# Patient Record
Sex: Female | Born: 1955 | Race: White | Hispanic: No
Health system: Southern US, Community
[De-identification: ages and names within clinical notes are randomized; demographics above are authoritative.]

---

## 2009-04-14 ENCOUNTER — Encounter: Admission: RE | Admit: 2009-04-14 | Discharge: 2009-04-14 | Payer: Self-pay | Admitting: Internal Medicine

## 2010-06-14 ENCOUNTER — Other Ambulatory Visit: Payer: Self-pay | Admitting: Internal Medicine

## 2010-06-14 DIAGNOSIS — Z1231 Encounter for screening mammogram for malignant neoplasm of breast: Secondary | ICD-10-CM

## 2010-06-18 ENCOUNTER — Ambulatory Visit
Admission: RE | Admit: 2010-06-18 | Discharge: 2010-06-18 | Disposition: A | Discharge status: Home / Self Care | Payer: 59 | Source: Ambulatory Visit | Attending: Internal Medicine | Admitting: Internal Medicine

## 2010-06-18 DIAGNOSIS — Z1231 Encounter for screening mammogram for malignant neoplasm of breast: Secondary | ICD-10-CM

## 2011-12-25 ENCOUNTER — Other Ambulatory Visit: Payer: Self-pay | Admitting: Internal Medicine

## 2011-12-25 DIAGNOSIS — Z1231 Encounter for screening mammogram for malignant neoplasm of breast: Secondary | ICD-10-CM

## 2012-02-03 ENCOUNTER — Ambulatory Visit
Admission: RE | Admit: 2012-02-03 | Discharge: 2012-02-03 | Disposition: A | Discharge status: Home / Self Care | Payer: 59 | Source: Ambulatory Visit | Attending: Internal Medicine | Admitting: Internal Medicine

## 2012-02-03 DIAGNOSIS — Z1231 Encounter for screening mammogram for malignant neoplasm of breast: Secondary | ICD-10-CM

## 2012-08-10 ENCOUNTER — Encounter (INDEPENDENT_AMBULATORY_CARE_PROVIDER_SITE_OTHER): Payer: 59 | Admitting: Ophthalmology

## 2012-08-10 DIAGNOSIS — H2510 Age-related nuclear cataract, unspecified eye: Secondary | ICD-10-CM

## 2012-08-10 DIAGNOSIS — E1139 Type 2 diabetes mellitus with other diabetic ophthalmic complication: Secondary | ICD-10-CM

## 2012-08-10 DIAGNOSIS — H43819 Vitreous degeneration, unspecified eye: Secondary | ICD-10-CM

## 2012-08-10 DIAGNOSIS — E11311 Type 2 diabetes mellitus with unspecified diabetic retinopathy with macular edema: Secondary | ICD-10-CM

## 2012-08-10 DIAGNOSIS — E11319 Type 2 diabetes mellitus with unspecified diabetic retinopathy without macular edema: Secondary | ICD-10-CM

## 2012-09-04 ENCOUNTER — Encounter (INDEPENDENT_AMBULATORY_CARE_PROVIDER_SITE_OTHER): Payer: 59 | Admitting: Ophthalmology

## 2012-09-04 DIAGNOSIS — E11311 Type 2 diabetes mellitus with unspecified diabetic retinopathy with macular edema: Secondary | ICD-10-CM

## 2012-09-04 DIAGNOSIS — E11319 Type 2 diabetes mellitus with unspecified diabetic retinopathy without macular edema: Secondary | ICD-10-CM

## 2012-09-04 DIAGNOSIS — E1139 Type 2 diabetes mellitus with other diabetic ophthalmic complication: Secondary | ICD-10-CM

## 2012-09-04 DIAGNOSIS — H43819 Vitreous degeneration, unspecified eye: Secondary | ICD-10-CM

## 2012-10-02 ENCOUNTER — Encounter (INDEPENDENT_AMBULATORY_CARE_PROVIDER_SITE_OTHER): Payer: 59 | Admitting: Ophthalmology

## 2012-10-02 DIAGNOSIS — E11311 Type 2 diabetes mellitus with unspecified diabetic retinopathy with macular edema: Secondary | ICD-10-CM

## 2012-10-02 DIAGNOSIS — H43819 Vitreous degeneration, unspecified eye: Secondary | ICD-10-CM

## 2012-10-02 DIAGNOSIS — I10 Essential (primary) hypertension: Secondary | ICD-10-CM

## 2012-10-02 DIAGNOSIS — H35039 Hypertensive retinopathy, unspecified eye: Secondary | ICD-10-CM

## 2012-10-02 DIAGNOSIS — E11319 Type 2 diabetes mellitus with unspecified diabetic retinopathy without macular edema: Secondary | ICD-10-CM

## 2012-10-02 DIAGNOSIS — E1139 Type 2 diabetes mellitus with other diabetic ophthalmic complication: Secondary | ICD-10-CM

## 2012-11-12 ENCOUNTER — Encounter (INDEPENDENT_AMBULATORY_CARE_PROVIDER_SITE_OTHER): Payer: 59 | Admitting: Ophthalmology

## 2012-11-12 DIAGNOSIS — E11311 Type 2 diabetes mellitus with unspecified diabetic retinopathy with macular edema: Secondary | ICD-10-CM

## 2012-11-12 DIAGNOSIS — E1139 Type 2 diabetes mellitus with other diabetic ophthalmic complication: Secondary | ICD-10-CM

## 2012-11-12 DIAGNOSIS — H43819 Vitreous degeneration, unspecified eye: Secondary | ICD-10-CM

## 2012-11-12 DIAGNOSIS — H2510 Age-related nuclear cataract, unspecified eye: Secondary | ICD-10-CM

## 2012-11-12 DIAGNOSIS — H35039 Hypertensive retinopathy, unspecified eye: Secondary | ICD-10-CM

## 2012-11-12 DIAGNOSIS — E11319 Type 2 diabetes mellitus with unspecified diabetic retinopathy without macular edema: Secondary | ICD-10-CM

## 2012-11-12 DIAGNOSIS — I10 Essential (primary) hypertension: Secondary | ICD-10-CM

## 2012-12-31 ENCOUNTER — Encounter (INDEPENDENT_AMBULATORY_CARE_PROVIDER_SITE_OTHER): Payer: 59 | Admitting: Ophthalmology

## 2013-01-01 ENCOUNTER — Encounter (INDEPENDENT_AMBULATORY_CARE_PROVIDER_SITE_OTHER): Payer: 59 | Admitting: Ophthalmology

## 2013-01-01 DIAGNOSIS — I10 Essential (primary) hypertension: Secondary | ICD-10-CM

## 2013-01-01 DIAGNOSIS — H2510 Age-related nuclear cataract, unspecified eye: Secondary | ICD-10-CM

## 2013-01-01 DIAGNOSIS — H43819 Vitreous degeneration, unspecified eye: Secondary | ICD-10-CM

## 2013-01-01 DIAGNOSIS — E11319 Type 2 diabetes mellitus with unspecified diabetic retinopathy without macular edema: Secondary | ICD-10-CM

## 2013-01-01 DIAGNOSIS — E1139 Type 2 diabetes mellitus with other diabetic ophthalmic complication: Secondary | ICD-10-CM

## 2013-01-01 DIAGNOSIS — H35039 Hypertensive retinopathy, unspecified eye: Secondary | ICD-10-CM

## 2013-01-01 DIAGNOSIS — E11311 Type 2 diabetes mellitus with unspecified diabetic retinopathy with macular edema: Secondary | ICD-10-CM

## 2013-02-12 ENCOUNTER — Encounter (INDEPENDENT_AMBULATORY_CARE_PROVIDER_SITE_OTHER): Payer: 59 | Admitting: Ophthalmology

## 2013-02-12 DIAGNOSIS — H35039 Hypertensive retinopathy, unspecified eye: Secondary | ICD-10-CM

## 2013-02-12 DIAGNOSIS — H2510 Age-related nuclear cataract, unspecified eye: Secondary | ICD-10-CM

## 2013-02-12 DIAGNOSIS — I10 Essential (primary) hypertension: Secondary | ICD-10-CM

## 2013-02-12 DIAGNOSIS — E1165 Type 2 diabetes mellitus with hyperglycemia: Secondary | ICD-10-CM

## 2013-02-12 DIAGNOSIS — E1139 Type 2 diabetes mellitus with other diabetic ophthalmic complication: Secondary | ICD-10-CM

## 2013-02-12 DIAGNOSIS — H43819 Vitreous degeneration, unspecified eye: Secondary | ICD-10-CM

## 2013-02-12 DIAGNOSIS — E11319 Type 2 diabetes mellitus with unspecified diabetic retinopathy without macular edema: Secondary | ICD-10-CM

## 2013-02-12 DIAGNOSIS — E11311 Type 2 diabetes mellitus with unspecified diabetic retinopathy with macular edema: Secondary | ICD-10-CM

## 2013-03-26 ENCOUNTER — Encounter (INDEPENDENT_AMBULATORY_CARE_PROVIDER_SITE_OTHER): Payer: 59 | Admitting: Ophthalmology

## 2013-03-29 ENCOUNTER — Encounter (INDEPENDENT_AMBULATORY_CARE_PROVIDER_SITE_OTHER): Payer: 59 | Admitting: Ophthalmology

## 2013-03-29 DIAGNOSIS — H43819 Vitreous degeneration, unspecified eye: Secondary | ICD-10-CM

## 2013-03-29 DIAGNOSIS — E11311 Type 2 diabetes mellitus with unspecified diabetic retinopathy with macular edema: Secondary | ICD-10-CM

## 2013-03-29 DIAGNOSIS — H35039 Hypertensive retinopathy, unspecified eye: Secondary | ICD-10-CM

## 2013-03-29 DIAGNOSIS — E1165 Type 2 diabetes mellitus with hyperglycemia: Secondary | ICD-10-CM

## 2013-03-29 DIAGNOSIS — E11319 Type 2 diabetes mellitus with unspecified diabetic retinopathy without macular edema: Secondary | ICD-10-CM

## 2013-03-29 DIAGNOSIS — I10 Essential (primary) hypertension: Secondary | ICD-10-CM

## 2013-03-29 DIAGNOSIS — E1139 Type 2 diabetes mellitus with other diabetic ophthalmic complication: Secondary | ICD-10-CM

## 2013-03-29 DIAGNOSIS — H2510 Age-related nuclear cataract, unspecified eye: Secondary | ICD-10-CM

## 2013-05-13 ENCOUNTER — Other Ambulatory Visit: Payer: Self-pay

## 2013-05-13 DIAGNOSIS — Z1231 Encounter for screening mammogram for malignant neoplasm of breast: Secondary | ICD-10-CM

## 2013-05-14 ENCOUNTER — Encounter (INDEPENDENT_AMBULATORY_CARE_PROVIDER_SITE_OTHER): Payer: 59 | Admitting: Ophthalmology

## 2013-05-14 DIAGNOSIS — E11311 Type 2 diabetes mellitus with unspecified diabetic retinopathy with macular edema: Secondary | ICD-10-CM

## 2013-05-14 DIAGNOSIS — I10 Essential (primary) hypertension: Secondary | ICD-10-CM

## 2013-05-14 DIAGNOSIS — E1139 Type 2 diabetes mellitus with other diabetic ophthalmic complication: Secondary | ICD-10-CM

## 2013-05-14 DIAGNOSIS — H35039 Hypertensive retinopathy, unspecified eye: Secondary | ICD-10-CM

## 2013-05-14 DIAGNOSIS — E11319 Type 2 diabetes mellitus with unspecified diabetic retinopathy without macular edema: Secondary | ICD-10-CM

## 2013-05-14 DIAGNOSIS — H43819 Vitreous degeneration, unspecified eye: Secondary | ICD-10-CM

## 2013-05-14 DIAGNOSIS — E1165 Type 2 diabetes mellitus with hyperglycemia: Secondary | ICD-10-CM

## 2013-05-24 ENCOUNTER — Encounter (INDEPENDENT_AMBULATORY_CARE_PROVIDER_SITE_OTHER): Payer: Self-pay

## 2013-05-24 ENCOUNTER — Ambulatory Visit
Admission: RE | Admit: 2013-05-24 | Discharge: 2013-05-24 | Disposition: A | Discharge status: Home / Self Care | Payer: Self-pay | Source: Ambulatory Visit

## 2013-05-24 DIAGNOSIS — Z1231 Encounter for screening mammogram for malignant neoplasm of breast: Secondary | ICD-10-CM

## 2013-06-25 ENCOUNTER — Encounter (INDEPENDENT_AMBULATORY_CARE_PROVIDER_SITE_OTHER): Payer: 59 | Admitting: Ophthalmology

## 2013-06-25 DIAGNOSIS — E11319 Type 2 diabetes mellitus with unspecified diabetic retinopathy without macular edema: Secondary | ICD-10-CM

## 2013-06-25 DIAGNOSIS — H43819 Vitreous degeneration, unspecified eye: Secondary | ICD-10-CM

## 2013-06-25 DIAGNOSIS — H353290 Exudative age-related macular degeneration, unspecified eye, stage unspecified: Secondary | ICD-10-CM

## 2013-06-25 DIAGNOSIS — I10 Essential (primary) hypertension: Secondary | ICD-10-CM

## 2013-06-25 DIAGNOSIS — H3530 Unspecified macular degeneration: Secondary | ICD-10-CM

## 2013-06-25 DIAGNOSIS — E1139 Type 2 diabetes mellitus with other diabetic ophthalmic complication: Secondary | ICD-10-CM

## 2013-06-25 DIAGNOSIS — H35039 Hypertensive retinopathy, unspecified eye: Secondary | ICD-10-CM

## 2013-06-25 DIAGNOSIS — E1165 Type 2 diabetes mellitus with hyperglycemia: Secondary | ICD-10-CM

## 2013-08-13 ENCOUNTER — Encounter (INDEPENDENT_AMBULATORY_CARE_PROVIDER_SITE_OTHER): Payer: 59 | Admitting: Ophthalmology

## 2013-08-13 DIAGNOSIS — H43819 Vitreous degeneration, unspecified eye: Secondary | ICD-10-CM

## 2013-08-13 DIAGNOSIS — E1165 Type 2 diabetes mellitus with hyperglycemia: Secondary | ICD-10-CM

## 2013-08-13 DIAGNOSIS — H35039 Hypertensive retinopathy, unspecified eye: Secondary | ICD-10-CM

## 2013-08-13 DIAGNOSIS — E11311 Type 2 diabetes mellitus with unspecified diabetic retinopathy with macular edema: Secondary | ICD-10-CM

## 2013-08-13 DIAGNOSIS — E1139 Type 2 diabetes mellitus with other diabetic ophthalmic complication: Secondary | ICD-10-CM

## 2013-08-13 DIAGNOSIS — H2510 Age-related nuclear cataract, unspecified eye: Secondary | ICD-10-CM

## 2013-08-13 DIAGNOSIS — E11319 Type 2 diabetes mellitus with unspecified diabetic retinopathy without macular edema: Secondary | ICD-10-CM

## 2013-08-13 DIAGNOSIS — I10 Essential (primary) hypertension: Secondary | ICD-10-CM

## 2013-10-22 ENCOUNTER — Encounter (INDEPENDENT_AMBULATORY_CARE_PROVIDER_SITE_OTHER): Payer: 59 | Admitting: Ophthalmology

## 2013-10-22 DIAGNOSIS — H35039 Hypertensive retinopathy, unspecified eye: Secondary | ICD-10-CM

## 2013-10-22 DIAGNOSIS — E11319 Type 2 diabetes mellitus with unspecified diabetic retinopathy without macular edema: Secondary | ICD-10-CM

## 2013-10-22 DIAGNOSIS — H43819 Vitreous degeneration, unspecified eye: Secondary | ICD-10-CM

## 2013-10-22 DIAGNOSIS — E11311 Type 2 diabetes mellitus with unspecified diabetic retinopathy with macular edema: Secondary | ICD-10-CM

## 2013-10-22 DIAGNOSIS — I10 Essential (primary) hypertension: Secondary | ICD-10-CM

## 2013-10-22 DIAGNOSIS — E1139 Type 2 diabetes mellitus with other diabetic ophthalmic complication: Secondary | ICD-10-CM

## 2013-10-22 DIAGNOSIS — E1165 Type 2 diabetes mellitus with hyperglycemia: Secondary | ICD-10-CM

## 2013-10-22 DIAGNOSIS — H2510 Age-related nuclear cataract, unspecified eye: Secondary | ICD-10-CM

## 2014-01-07 ENCOUNTER — Encounter (INDEPENDENT_AMBULATORY_CARE_PROVIDER_SITE_OTHER): Payer: 59 | Admitting: Ophthalmology

## 2014-01-07 DIAGNOSIS — I10 Essential (primary) hypertension: Secondary | ICD-10-CM

## 2014-01-07 DIAGNOSIS — H35033 Hypertensive retinopathy, bilateral: Secondary | ICD-10-CM

## 2014-01-07 DIAGNOSIS — H43813 Vitreous degeneration, bilateral: Secondary | ICD-10-CM

## 2014-01-07 DIAGNOSIS — E11311 Type 2 diabetes mellitus with unspecified diabetic retinopathy with macular edema: Secondary | ICD-10-CM

## 2014-01-07 DIAGNOSIS — E11329 Type 2 diabetes mellitus with mild nonproliferative diabetic retinopathy without macular edema: Secondary | ICD-10-CM

## 2014-01-07 DIAGNOSIS — E11331 Type 2 diabetes mellitus with moderate nonproliferative diabetic retinopathy with macular edema: Secondary | ICD-10-CM

## 2014-03-25 ENCOUNTER — Encounter (INDEPENDENT_AMBULATORY_CARE_PROVIDER_SITE_OTHER): Payer: 59 | Admitting: Ophthalmology

## 2014-03-28 ENCOUNTER — Encounter (INDEPENDENT_AMBULATORY_CARE_PROVIDER_SITE_OTHER): Payer: 59 | Admitting: Ophthalmology

## 2014-03-28 DIAGNOSIS — H43813 Vitreous degeneration, bilateral: Secondary | ICD-10-CM

## 2014-03-28 DIAGNOSIS — E11331 Type 2 diabetes mellitus with moderate nonproliferative diabetic retinopathy with macular edema: Secondary | ICD-10-CM

## 2014-03-28 DIAGNOSIS — E11311 Type 2 diabetes mellitus with unspecified diabetic retinopathy with macular edema: Secondary | ICD-10-CM

## 2014-03-28 DIAGNOSIS — H35033 Hypertensive retinopathy, bilateral: Secondary | ICD-10-CM

## 2014-03-28 DIAGNOSIS — I10 Essential (primary) hypertension: Secondary | ICD-10-CM | POA: Diagnosis not present

## 2014-03-28 DIAGNOSIS — E11329 Type 2 diabetes mellitus with mild nonproliferative diabetic retinopathy without macular edema: Secondary | ICD-10-CM | POA: Diagnosis not present

## 2014-03-28 DIAGNOSIS — H2513 Age-related nuclear cataract, bilateral: Secondary | ICD-10-CM | POA: Diagnosis not present

## 2014-06-13 ENCOUNTER — Encounter (INDEPENDENT_AMBULATORY_CARE_PROVIDER_SITE_OTHER): Payer: 59 | Admitting: Ophthalmology

## 2014-06-13 DIAGNOSIS — H43813 Vitreous degeneration, bilateral: Secondary | ICD-10-CM | POA: Diagnosis not present

## 2014-06-13 DIAGNOSIS — E11331 Type 2 diabetes mellitus with moderate nonproliferative diabetic retinopathy with macular edema: Secondary | ICD-10-CM

## 2014-06-13 DIAGNOSIS — H2513 Age-related nuclear cataract, bilateral: Secondary | ICD-10-CM | POA: Diagnosis not present

## 2014-06-13 DIAGNOSIS — E11329 Type 2 diabetes mellitus with mild nonproliferative diabetic retinopathy without macular edema: Secondary | ICD-10-CM

## 2014-06-13 DIAGNOSIS — H35033 Hypertensive retinopathy, bilateral: Secondary | ICD-10-CM | POA: Diagnosis not present

## 2014-06-13 DIAGNOSIS — E11311 Type 2 diabetes mellitus with unspecified diabetic retinopathy with macular edema: Secondary | ICD-10-CM

## 2014-06-13 DIAGNOSIS — I10 Essential (primary) hypertension: Secondary | ICD-10-CM | POA: Diagnosis not present

## 2014-09-09 ENCOUNTER — Encounter (INDEPENDENT_AMBULATORY_CARE_PROVIDER_SITE_OTHER): Payer: 59 | Admitting: Ophthalmology

## 2014-09-19 ENCOUNTER — Encounter (INDEPENDENT_AMBULATORY_CARE_PROVIDER_SITE_OTHER): Payer: 59 | Admitting: Ophthalmology

## 2014-09-19 DIAGNOSIS — E11321 Type 2 diabetes mellitus with mild nonproliferative diabetic retinopathy with macular edema: Secondary | ICD-10-CM

## 2014-09-19 DIAGNOSIS — I10 Essential (primary) hypertension: Secondary | ICD-10-CM | POA: Diagnosis not present

## 2014-09-19 DIAGNOSIS — H43813 Vitreous degeneration, bilateral: Secondary | ICD-10-CM | POA: Diagnosis not present

## 2014-09-19 DIAGNOSIS — E11329 Type 2 diabetes mellitus with mild nonproliferative diabetic retinopathy without macular edema: Secondary | ICD-10-CM

## 2014-09-19 DIAGNOSIS — H35033 Hypertensive retinopathy, bilateral: Secondary | ICD-10-CM | POA: Diagnosis not present

## 2014-09-19 DIAGNOSIS — E11311 Type 2 diabetes mellitus with unspecified diabetic retinopathy with macular edema: Secondary | ICD-10-CM

## 2015-01-04 ENCOUNTER — Other Ambulatory Visit: Payer: Self-pay

## 2015-01-04 DIAGNOSIS — Z1231 Encounter for screening mammogram for malignant neoplasm of breast: Secondary | ICD-10-CM

## 2015-01-09 ENCOUNTER — Encounter (INDEPENDENT_AMBULATORY_CARE_PROVIDER_SITE_OTHER): Payer: 59 | Admitting: Ophthalmology

## 2015-01-30 ENCOUNTER — Encounter (INDEPENDENT_AMBULATORY_CARE_PROVIDER_SITE_OTHER): Payer: 59 | Admitting: Ophthalmology

## 2015-02-02 ENCOUNTER — Encounter (INDEPENDENT_AMBULATORY_CARE_PROVIDER_SITE_OTHER): Payer: 59 | Admitting: Ophthalmology

## 2015-02-02 DIAGNOSIS — H35033 Hypertensive retinopathy, bilateral: Secondary | ICD-10-CM

## 2015-02-02 DIAGNOSIS — E11319 Type 2 diabetes mellitus with unspecified diabetic retinopathy without macular edema: Secondary | ICD-10-CM

## 2015-02-02 DIAGNOSIS — E113293 Type 2 diabetes mellitus with mild nonproliferative diabetic retinopathy without macular edema, bilateral: Secondary | ICD-10-CM | POA: Diagnosis not present

## 2015-02-02 DIAGNOSIS — I10 Essential (primary) hypertension: Secondary | ICD-10-CM

## 2015-02-02 DIAGNOSIS — H43813 Vitreous degeneration, bilateral: Secondary | ICD-10-CM | POA: Diagnosis not present

## 2015-02-03 ENCOUNTER — Ambulatory Visit
Admission: RE | Admit: 2015-02-03 | Discharge: 2015-02-03 | Disposition: A | Discharge status: Home / Self Care | Payer: 59 | Source: Ambulatory Visit

## 2015-02-03 DIAGNOSIS — Z1231 Encounter for screening mammogram for malignant neoplasm of breast: Secondary | ICD-10-CM

## 2015-06-09 ENCOUNTER — Ambulatory Visit (INDEPENDENT_AMBULATORY_CARE_PROVIDER_SITE_OTHER): Payer: 59 | Admitting: Ophthalmology

## 2015-06-09 DIAGNOSIS — I10 Essential (primary) hypertension: Secondary | ICD-10-CM

## 2015-06-09 DIAGNOSIS — E113291 Type 2 diabetes mellitus with mild nonproliferative diabetic retinopathy without macular edema, right eye: Secondary | ICD-10-CM | POA: Diagnosis not present

## 2015-06-09 DIAGNOSIS — E11311 Type 2 diabetes mellitus with unspecified diabetic retinopathy with macular edema: Secondary | ICD-10-CM

## 2015-06-09 DIAGNOSIS — E113212 Type 2 diabetes mellitus with mild nonproliferative diabetic retinopathy with macular edema, left eye: Secondary | ICD-10-CM

## 2015-06-09 DIAGNOSIS — H2513 Age-related nuclear cataract, bilateral: Secondary | ICD-10-CM | POA: Diagnosis not present

## 2015-06-09 DIAGNOSIS — H35033 Hypertensive retinopathy, bilateral: Secondary | ICD-10-CM

## 2015-06-09 DIAGNOSIS — H43813 Vitreous degeneration, bilateral: Secondary | ICD-10-CM

## 2015-10-06 ENCOUNTER — Ambulatory Visit (INDEPENDENT_AMBULATORY_CARE_PROVIDER_SITE_OTHER): Payer: 59 | Admitting: Ophthalmology

## 2015-10-30 ENCOUNTER — Ambulatory Visit (INDEPENDENT_AMBULATORY_CARE_PROVIDER_SITE_OTHER): Payer: 59 | Admitting: Ophthalmology

## 2015-10-30 DIAGNOSIS — E11311 Type 2 diabetes mellitus with unspecified diabetic retinopathy with macular edema: Secondary | ICD-10-CM | POA: Diagnosis not present

## 2015-10-30 DIAGNOSIS — E113291 Type 2 diabetes mellitus with mild nonproliferative diabetic retinopathy without macular edema, right eye: Secondary | ICD-10-CM | POA: Diagnosis not present

## 2015-10-30 DIAGNOSIS — H35033 Hypertensive retinopathy, bilateral: Secondary | ICD-10-CM | POA: Diagnosis not present

## 2015-10-30 DIAGNOSIS — I10 Essential (primary) hypertension: Secondary | ICD-10-CM

## 2015-10-30 DIAGNOSIS — H43813 Vitreous degeneration, bilateral: Secondary | ICD-10-CM

## 2015-10-30 DIAGNOSIS — E113312 Type 2 diabetes mellitus with moderate nonproliferative diabetic retinopathy with macular edema, left eye: Secondary | ICD-10-CM | POA: Diagnosis not present

## 2016-01-08 ENCOUNTER — Encounter (INDEPENDENT_AMBULATORY_CARE_PROVIDER_SITE_OTHER): Payer: 59 | Admitting: Ophthalmology

## 2016-01-08 DIAGNOSIS — H43813 Vitreous degeneration, bilateral: Secondary | ICD-10-CM

## 2016-01-08 DIAGNOSIS — E113312 Type 2 diabetes mellitus with moderate nonproliferative diabetic retinopathy with macular edema, left eye: Secondary | ICD-10-CM | POA: Diagnosis not present

## 2016-01-08 DIAGNOSIS — I10 Essential (primary) hypertension: Secondary | ICD-10-CM

## 2016-01-08 DIAGNOSIS — H35033 Hypertensive retinopathy, bilateral: Secondary | ICD-10-CM

## 2016-01-08 DIAGNOSIS — E113291 Type 2 diabetes mellitus with mild nonproliferative diabetic retinopathy without macular edema, right eye: Secondary | ICD-10-CM

## 2016-01-08 DIAGNOSIS — E11311 Type 2 diabetes mellitus with unspecified diabetic retinopathy with macular edema: Secondary | ICD-10-CM | POA: Diagnosis not present

## 2016-02-26 ENCOUNTER — Encounter (INDEPENDENT_AMBULATORY_CARE_PROVIDER_SITE_OTHER): Payer: 59 | Admitting: Ophthalmology

## 2016-02-26 DIAGNOSIS — I10 Essential (primary) hypertension: Secondary | ICD-10-CM

## 2016-02-26 DIAGNOSIS — H43813 Vitreous degeneration, bilateral: Secondary | ICD-10-CM | POA: Diagnosis not present

## 2016-02-26 DIAGNOSIS — E113291 Type 2 diabetes mellitus with mild nonproliferative diabetic retinopathy without macular edema, right eye: Secondary | ICD-10-CM | POA: Diagnosis not present

## 2016-02-26 DIAGNOSIS — E113312 Type 2 diabetes mellitus with moderate nonproliferative diabetic retinopathy with macular edema, left eye: Secondary | ICD-10-CM

## 2016-02-26 DIAGNOSIS — H35033 Hypertensive retinopathy, bilateral: Secondary | ICD-10-CM

## 2016-02-26 DIAGNOSIS — E11311 Type 2 diabetes mellitus with unspecified diabetic retinopathy with macular edema: Secondary | ICD-10-CM | POA: Diagnosis not present

## 2016-04-22 ENCOUNTER — Encounter (INDEPENDENT_AMBULATORY_CARE_PROVIDER_SITE_OTHER): Payer: 59 | Admitting: Ophthalmology

## 2016-04-22 DIAGNOSIS — H43813 Vitreous degeneration, bilateral: Secondary | ICD-10-CM | POA: Diagnosis not present

## 2016-04-22 DIAGNOSIS — E113311 Type 2 diabetes mellitus with moderate nonproliferative diabetic retinopathy with macular edema, right eye: Secondary | ICD-10-CM

## 2016-04-22 DIAGNOSIS — I10 Essential (primary) hypertension: Secondary | ICD-10-CM

## 2016-04-22 DIAGNOSIS — E113291 Type 2 diabetes mellitus with mild nonproliferative diabetic retinopathy without macular edema, right eye: Secondary | ICD-10-CM

## 2016-04-22 DIAGNOSIS — H35033 Hypertensive retinopathy, bilateral: Secondary | ICD-10-CM | POA: Diagnosis not present

## 2016-04-22 DIAGNOSIS — E11311 Type 2 diabetes mellitus with unspecified diabetic retinopathy with macular edema: Secondary | ICD-10-CM | POA: Diagnosis not present

## 2016-07-13 IMAGING — MG MM DIGITAL SCREENING BILAT W/ CAD
4 series · 4 of 4 positions shown · non-contrast
Comparison: Previous exam(s).

CLINICAL DATA: Screening.

EXAM:
DIGITAL SCREENING BILATERAL MAMMOGRAM WITH CAD

[R CC]
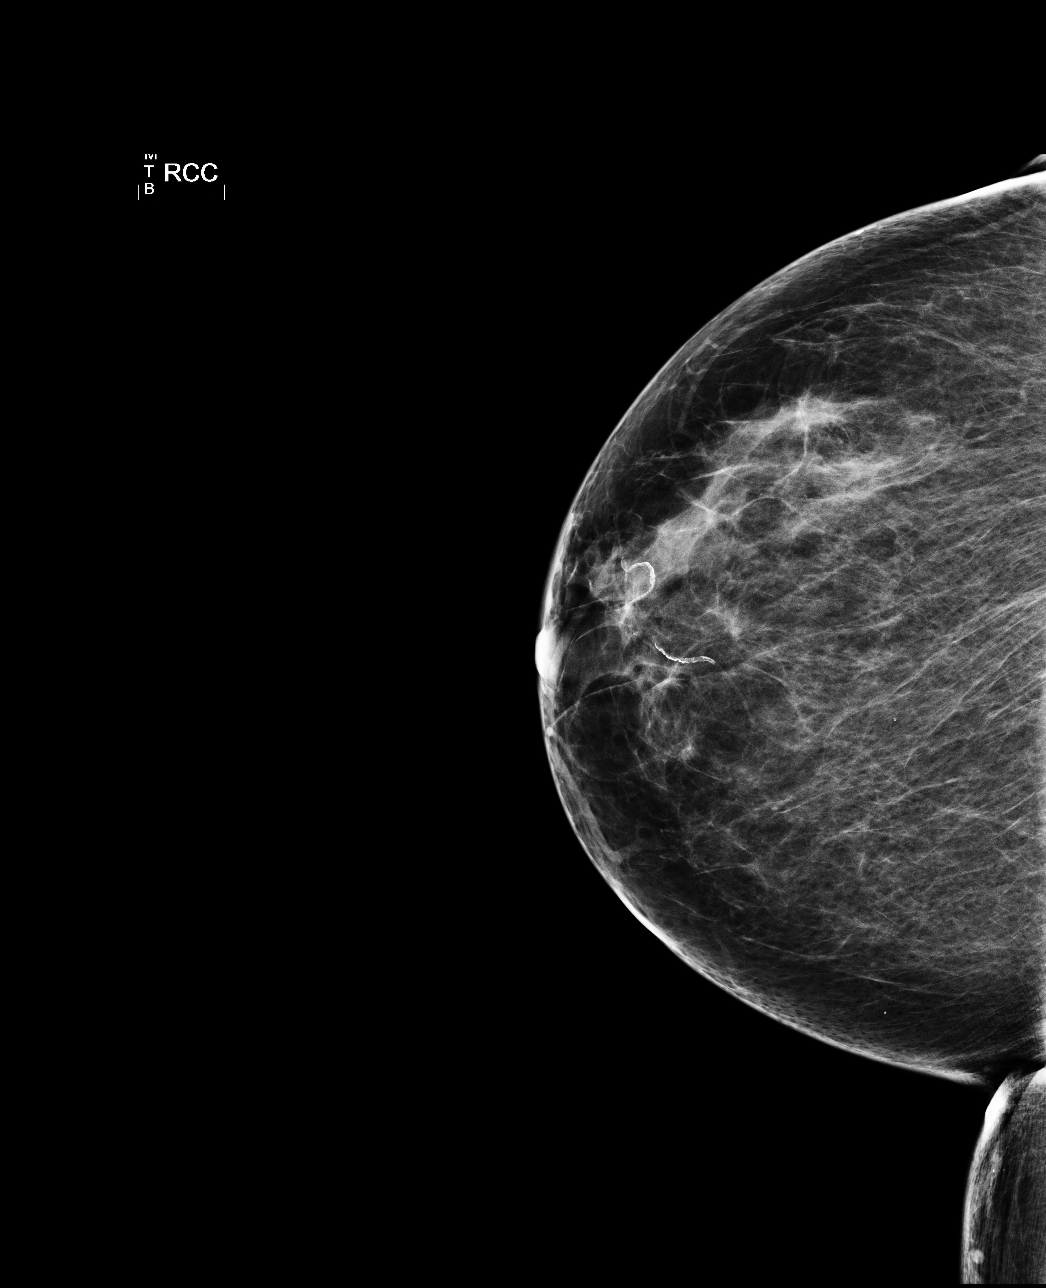

[L CC]
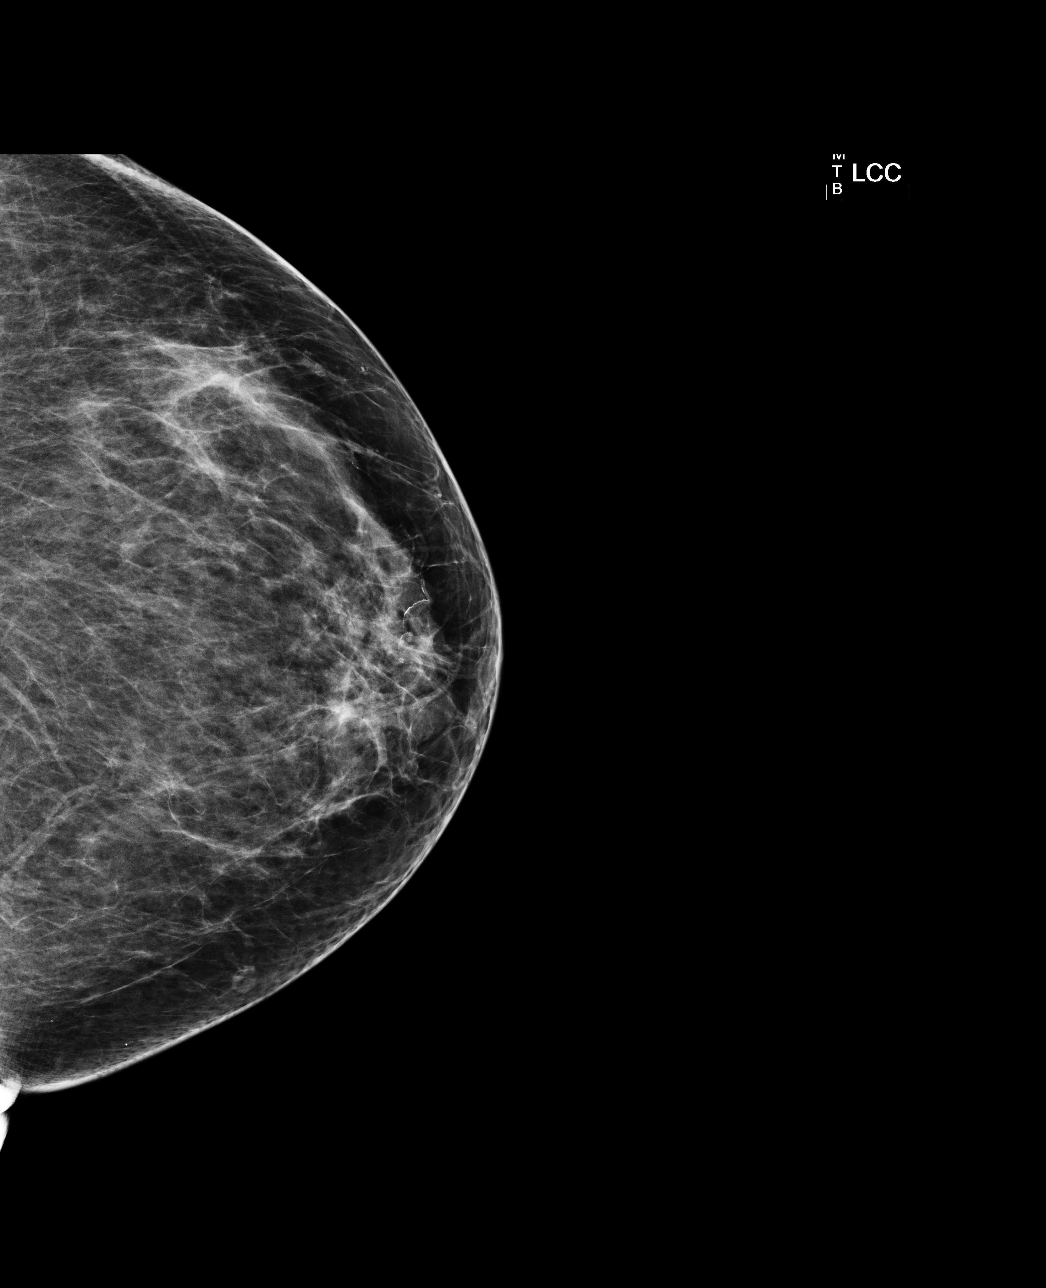

[L MLO]
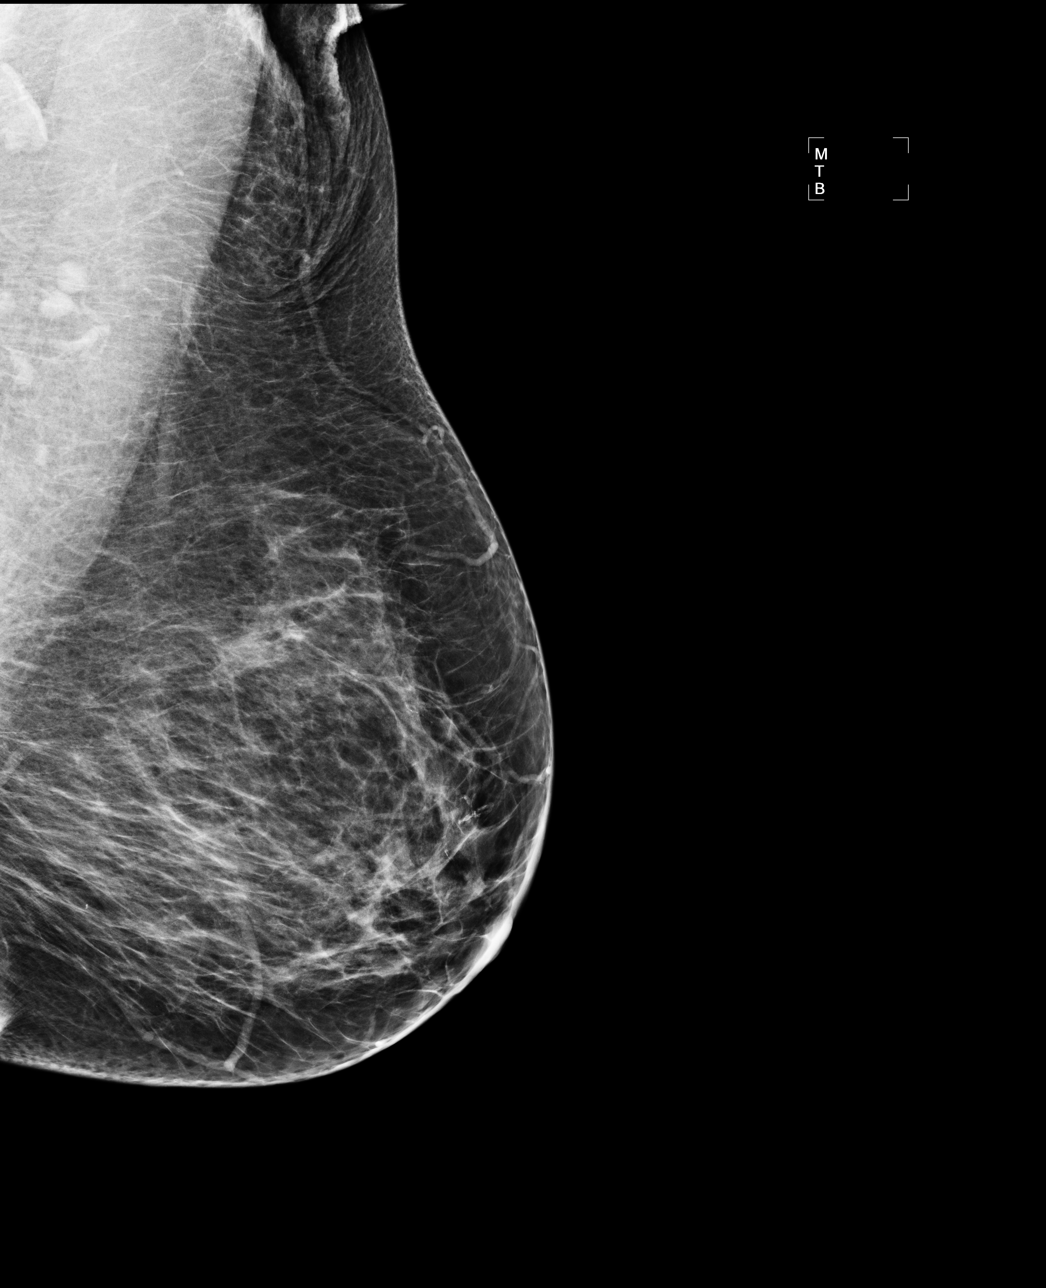

[R MLO]
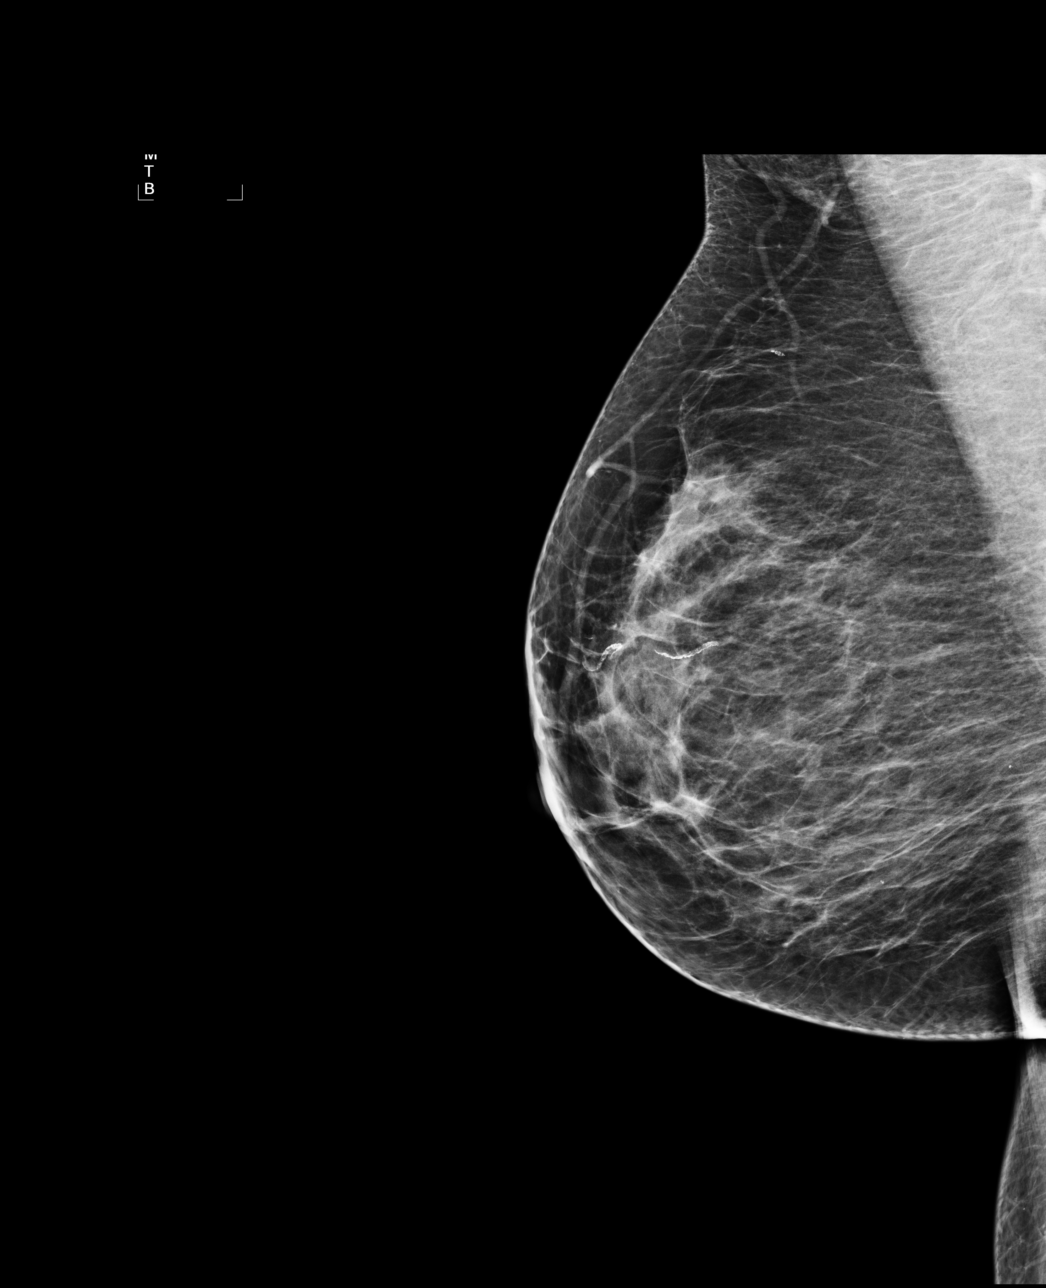

[4 of 4 positions shown; findings below may reference images not displayed]

ACR Breast Density Category b: There are scattered areas of
fibroglandular density.
FINDINGS: There are no findings suspicious for malignancy. Images were
processed with CAD.
IMPRESSION: No mammographic evidence of malignancy. A result letter of this
screening mammogram will be mailed directly to the patient.

RECOMMENDATION:
Screening mammogram in one year. (Code:AS-G-LCT)

BI-RADS CATEGORY  1: Negative.

## 2016-07-15 ENCOUNTER — Encounter (INDEPENDENT_AMBULATORY_CARE_PROVIDER_SITE_OTHER): Payer: 59 | Admitting: Ophthalmology

## 2016-07-15 DIAGNOSIS — E113391 Type 2 diabetes mellitus with moderate nonproliferative diabetic retinopathy without macular edema, right eye: Secondary | ICD-10-CM | POA: Diagnosis not present

## 2016-07-15 DIAGNOSIS — E11311 Type 2 diabetes mellitus with unspecified diabetic retinopathy with macular edema: Secondary | ICD-10-CM

## 2016-07-15 DIAGNOSIS — E113212 Type 2 diabetes mellitus with mild nonproliferative diabetic retinopathy with macular edema, left eye: Secondary | ICD-10-CM

## 2016-07-15 DIAGNOSIS — H35033 Hypertensive retinopathy, bilateral: Secondary | ICD-10-CM

## 2016-07-15 DIAGNOSIS — H43813 Vitreous degeneration, bilateral: Secondary | ICD-10-CM | POA: Diagnosis not present

## 2016-07-15 DIAGNOSIS — I10 Essential (primary) hypertension: Secondary | ICD-10-CM | POA: Diagnosis not present

## 2016-10-14 ENCOUNTER — Encounter (INDEPENDENT_AMBULATORY_CARE_PROVIDER_SITE_OTHER): Payer: 59 | Admitting: Ophthalmology

## 2016-11-04 ENCOUNTER — Encounter (INDEPENDENT_AMBULATORY_CARE_PROVIDER_SITE_OTHER): Payer: 59 | Admitting: Ophthalmology

## 2016-11-04 DIAGNOSIS — I10 Essential (primary) hypertension: Secondary | ICD-10-CM | POA: Diagnosis not present

## 2016-11-04 DIAGNOSIS — E113291 Type 2 diabetes mellitus with mild nonproliferative diabetic retinopathy without macular edema, right eye: Secondary | ICD-10-CM | POA: Diagnosis not present

## 2016-11-04 DIAGNOSIS — H2513 Age-related nuclear cataract, bilateral: Secondary | ICD-10-CM

## 2016-11-04 DIAGNOSIS — E11311 Type 2 diabetes mellitus with unspecified diabetic retinopathy with macular edema: Secondary | ICD-10-CM | POA: Diagnosis not present

## 2016-11-04 DIAGNOSIS — H35033 Hypertensive retinopathy, bilateral: Secondary | ICD-10-CM

## 2016-11-04 DIAGNOSIS — E113312 Type 2 diabetes mellitus with moderate nonproliferative diabetic retinopathy with macular edema, left eye: Secondary | ICD-10-CM | POA: Diagnosis not present

## 2016-11-04 DIAGNOSIS — H43813 Vitreous degeneration, bilateral: Secondary | ICD-10-CM

## 2017-02-17 ENCOUNTER — Encounter (INDEPENDENT_AMBULATORY_CARE_PROVIDER_SITE_OTHER): Payer: 59 | Admitting: Ophthalmology

## 2017-02-17 DIAGNOSIS — E113312 Type 2 diabetes mellitus with moderate nonproliferative diabetic retinopathy with macular edema, left eye: Secondary | ICD-10-CM

## 2017-02-17 DIAGNOSIS — E11311 Type 2 diabetes mellitus with unspecified diabetic retinopathy with macular edema: Secondary | ICD-10-CM | POA: Diagnosis not present

## 2017-02-17 DIAGNOSIS — H43813 Vitreous degeneration, bilateral: Secondary | ICD-10-CM | POA: Diagnosis not present

## 2017-02-17 DIAGNOSIS — E113291 Type 2 diabetes mellitus with mild nonproliferative diabetic retinopathy without macular edema, right eye: Secondary | ICD-10-CM | POA: Diagnosis not present

## 2017-02-17 DIAGNOSIS — I10 Essential (primary) hypertension: Secondary | ICD-10-CM

## 2017-02-17 DIAGNOSIS — H35033 Hypertensive retinopathy, bilateral: Secondary | ICD-10-CM | POA: Diagnosis not present

## 2017-04-28 ENCOUNTER — Encounter (INDEPENDENT_AMBULATORY_CARE_PROVIDER_SITE_OTHER): Payer: 59 | Admitting: Ophthalmology

## 2017-04-28 DIAGNOSIS — H35033 Hypertensive retinopathy, bilateral: Secondary | ICD-10-CM

## 2017-04-28 DIAGNOSIS — E11311 Type 2 diabetes mellitus with unspecified diabetic retinopathy with macular edema: Secondary | ICD-10-CM | POA: Diagnosis not present

## 2017-04-28 DIAGNOSIS — H43813 Vitreous degeneration, bilateral: Secondary | ICD-10-CM | POA: Diagnosis not present

## 2017-04-28 DIAGNOSIS — E113291 Type 2 diabetes mellitus with mild nonproliferative diabetic retinopathy without macular edema, right eye: Secondary | ICD-10-CM | POA: Diagnosis not present

## 2017-04-28 DIAGNOSIS — E113312 Type 2 diabetes mellitus with moderate nonproliferative diabetic retinopathy with macular edema, left eye: Secondary | ICD-10-CM | POA: Diagnosis not present

## 2017-04-28 DIAGNOSIS — I10 Essential (primary) hypertension: Secondary | ICD-10-CM | POA: Diagnosis not present

## 2017-07-21 ENCOUNTER — Encounter (INDEPENDENT_AMBULATORY_CARE_PROVIDER_SITE_OTHER): Payer: 59 | Admitting: Ophthalmology

## 2017-07-21 DIAGNOSIS — H35033 Hypertensive retinopathy, bilateral: Secondary | ICD-10-CM

## 2017-07-21 DIAGNOSIS — H43813 Vitreous degeneration, bilateral: Secondary | ICD-10-CM | POA: Diagnosis not present

## 2017-07-21 DIAGNOSIS — E113293 Type 2 diabetes mellitus with mild nonproliferative diabetic retinopathy without macular edema, bilateral: Secondary | ICD-10-CM | POA: Diagnosis not present

## 2017-07-21 DIAGNOSIS — E11319 Type 2 diabetes mellitus with unspecified diabetic retinopathy without macular edema: Secondary | ICD-10-CM

## 2017-07-21 DIAGNOSIS — I10 Essential (primary) hypertension: Secondary | ICD-10-CM

## 2017-10-13 ENCOUNTER — Encounter (INDEPENDENT_AMBULATORY_CARE_PROVIDER_SITE_OTHER): Payer: 59 | Admitting: Ophthalmology

## 2018-12-12 ENCOUNTER — Emergency Department (INDEPENDENT_AMBULATORY_CARE_PROVIDER_SITE_OTHER): Payer: PRIVATE HEALTH INSURANCE

## 2018-12-12 ENCOUNTER — Emergency Department (INDEPENDENT_AMBULATORY_CARE_PROVIDER_SITE_OTHER)
Admission: EM | Admit: 2018-12-12 | Discharge: 2018-12-12 | Disposition: A | Discharge status: Home / Self Care | Payer: PRIVATE HEALTH INSURANCE | Source: Home / Self Care

## 2018-12-12 ENCOUNTER — Other Ambulatory Visit: Payer: Self-pay

## 2018-12-12 ENCOUNTER — Encounter: Payer: Self-pay | Admitting: Emergency Medicine

## 2018-12-12 DIAGNOSIS — S332XXA Dislocation of sacroiliac and sacrococcygeal joint, initial encounter: Secondary | ICD-10-CM

## 2018-12-12 DIAGNOSIS — W010XXA Fall on same level from slipping, tripping and stumbling without subsequent striking against object, initial encounter: Secondary | ICD-10-CM | POA: Diagnosis not present

## 2018-12-12 DIAGNOSIS — W19XXXA Unspecified fall, initial encounter: Secondary | ICD-10-CM

## 2018-12-12 DIAGNOSIS — S0003XA Contusion of scalp, initial encounter: Secondary | ICD-10-CM | POA: Diagnosis not present

## 2018-12-12 NOTE — Discharge Instructions (Signed)
°  You may alternate cool and warm compresses to help with buttock pain. You may also take Tylenol and Motrin. Please follow up with family medicine later this week if not improving. You may want to start taking a stool softener this week to make bowel movements easier as the tailbone injury heals over the next few weeks.  Call 911 or have someone drive you to the hospital for further evaluation and treatment, especially if you develop a worsening headache, change in vision, vomiting, or other new concerning symptoms develop.

## 2018-12-12 NOTE — ED Provider Notes (Signed)
[facility]    CSN: [id number] Arrival date & time: 12/12/18  1051      History   Chief Complaint Chief Complaint  Patient presents with  . Fall    this morning at 230am after stumbling to restroom; lives alone    HPI Katie Rodgers is a 63 y.o. female.   HPI  Katie Rodgers is a 63 y.o. female presenting to UC with c/o posterior head pain with mild swelling and tailbone pain that started after slip and fall around 2:30AM this morning. Denies LOC but did feel slightly foggy right after.  She has also developed mild nausea and dizziness over the last few hours.  She has taken Tylenol and applied an ice pack to her tailbone with mild relief.  She was suppose [place] the [place] meet one of her daughters later today but felt a little foggy headed while driving so she decided to come be evaluated to make sure she was okay.  Denies change in vision. No weakness or numbness in arms or legs. No neck pain. She is not on blood thinners. No change in bowel or bladder habits.    History reviewed. No pertinent past medical history.  There are no active problems to display for this patient.   History reviewed. No pertinent surgical history.  OB History   No obstetric history on file.      Home Medications    Prior to Admission medications   Not on File    Family History History reviewed. No pertinent family history.  Social History Social History   Tobacco Use  . Smoking status: Not on file  Substance Use Topics  . Alcohol use: Not on file  . Drug use: Not on file     Allergies   Patient has no allergy information on record.   Review of Systems Review of Systems  Eyes: Negative for photophobia and visual disturbance.  Gastrointestinal: Positive for nausea. Negative for diarrhea and vomiting.  Musculoskeletal: Positive for arthralgias and back pain (buttock). Negative for gait problem, joint swelling, myalgias, neck pain and neck  stiffness.  Skin: Negative for color change and wound.  Neurological: Positive for dizziness (mild) and headaches. Negative for light-headedness.     Physical Exam Triage Vital Signs ED Triage Vitals  Enc Vitals Group     BP 12/12/18 1128 (!) 149/84     Pulse Rate 12/12/18 1128 72     Resp --      Temp 12/12/18 1128 97.8 F (36.6 C)     Temp Source 12/12/18 1128 Oral     SpO2 12/12/18 1128 97 %     Weight 12/12/18 1129 171 lb (77.6 kg)     Height 12/12/18 1129 5\' 2"  (1.575 m)     Head Circumference --      Peak Flow --      Pain Score 12/12/18 1129 6     Pain Loc --      Pain Edu? --      Excl. in GC? --    No data found.  Updated Vital Signs BP (!) 149/84 (BP Location: Right Arm)   Pulse 72   Temp 97.8 F (36.6 C) (Oral)   Ht 5\' 2"  (1.575 m)   Wt 171 lb (77.6 kg)   SpO2 97%   BMI 31.28 kg/m   Visual Acuity Right Eye Distance: 20/25 Left Eye Distance: 20/20 Bilateral Distance: 20/20  Right Eye Near:  Left Eye Near:    Bilateral Near:     Physical Exam Vitals signs and nursing note reviewed.  Constitutional:      General: She is not in acute distress.    Appearance: Normal appearance. She is well-developed. She is not ill-appearing, toxic-appearing or diaphoretic.  HENT:     Head: Normocephalic.      Right Ear: Tympanic membrane and ear canal normal.     Left Ear: Tympanic membrane and ear canal normal.     Nose: Nose normal.     Mouth/Throat:     Mouth: Mucous membranes are moist.     Pharynx: Oropharynx is clear.  Eyes:     Extraocular Movements: Extraocular movements intact.     Conjunctiva/sclera: Conjunctivae normal.     Pupils: Pupils are equal, round, and reactive to light.  Neck:     Musculoskeletal: Normal range of motion and neck supple.     Comments: No midline bone tenderness, no crepitus or step-offs.  Cardiovascular:     Rate and Rhythm: Normal rate and regular rhythm.  Pulmonary:     Effort: Pulmonary effort is normal. No  respiratory distress.     Breath sounds: Normal breath sounds.  Chest:     Chest wall: No tenderness.  Musculoskeletal: Normal range of motion.        General: Tenderness present.     Comments: Tenderness to sacrum and coccyx region. Full ROM lower extremities. Normal gait.  Full ROM upper extremities with 5/5 strength.   Skin:    General: Skin is warm and dry.     Capillary Refill: Capillary refill takes less than 2 seconds.  Neurological:     General: No focal deficit present.     Mental Status: She is alert and oriented to person, place, and time.     Cranial Nerves: No cranial nerve deficit.     Sensory: No sensory deficit.     Motor: No weakness.     Coordination: Coordination normal.     Gait: Gait normal.     Deep Tendon Reflexes: Reflexes normal.  Psychiatric:        Mood and Affect: Mood normal.        Behavior: Behavior normal.      UC Treatments / Results  Labs (all labs ordered are listed, but only abnormal results are displayed) Labs Reviewed - No data to display  EKG   Radiology Dg Sacrum/coccyx  Result Date: 12/12/2018 CLINICAL DATA:  Sacrococcygeal pain following a fall this morning. EXAM: SACRUM AND COCCYX - 2+ VIEW COMPARISON:  None. FINDINGS: There is 5 mm of posterior subluxation of the coccyx beginning at the 2nd coccygeal segment. No fractures are seen. L5-S1 degenerative changes. Bilateral atheromatous arterial calcifications. IMPRESSION: 1. Approximately 5 mm of posterior subluxation of the coccyx beginning at the 2nd coccygeal segment. 2. No fracture. 3. L5-S1 degenerative changes. Electronically Signed   By: Claudie Revering M.D.   On: 12/12/2018 11:59    Procedures Procedures (including critical care time)  Medications Ordered in UC Medications - No data to display  Initial Impression / Assessment and Plan / UC Course  I have reviewed the triage vital signs and the nursing notes.  Pertinent labs & imaging results that were available during my  care of the patient were reviewed by me and considered in my medical decision making (see chart for details).     Normal neuro exam.  Reviewed imaging, encouraged conservative tx with pain management. F/u as  needed, specially if new symptoms such as change to bowel or bladder habits or radiation of pain or numbness into legs.   Given patients age, advised pt if she felt like symptoms of dizziness, head pain and nausea were worsening, she may follow up in hospital for CT scan of her head.  Pt states she has another daughter nearby who can watch her if needed.  Pt lives nearby and feels comfortable driving herself home.    Final Clinical Impressions(s) / UC Diagnoses   Final diagnoses:  Fall from slip, trip, or stumble, initial encounter  Contusion of occipital region of scalp, initial encounter  Subluxation of coccyx, initial encounter     Discharge Instructions      You may alternate cool and warm compresses to help with buttock pain. You may also take Tylenol and Motrin. Please follow up with family medicine later this week if not improving. You may want to start taking a stool softener this week to make bowel movements easier as the tailbone injury heals over the next few weeks.  Call 911 or have someone drive you to the hospital for further evaluation and treatment, especially if you develop a worsening headache, change in vision, vomiting, or other new concerning symptoms develop.    ED Prescriptions    None     PDMP not reviewed this encounter.   Lurene Shadowhelps, Tecia Cinnamon O, [place] 12/13/18 1413

## 2018-12-12 NOTE — ED Triage Notes (Signed)
Here with posterior head pain and coccyx pain after falling this morning around 230am. Reports stumbled going to restroom when fell on the back of head and tailbone. Denies LOC but felt "foggy." Headache, nausea and dizziness noted. Slight swelling felt to back of head. Took Tylenol and applied ice pack to tailbone area. Alert/oriented x3.

## 2018-12-13 ENCOUNTER — Telehealth: Payer: Self-pay | Admitting: Emergency Medicine

## 2018-12-13 NOTE — Telephone Encounter (Signed)
Patient returned f/u call. Reports felling better than yesterday. Woke up from a nap about an hour ago and felt slight dizzy with headache. Daughter is with her. Advised to monitor headache for changes in vision, weakness, nausea and vomiting.

## 2018-12-14 ENCOUNTER — Telehealth: Payer: Self-pay

## 2018-12-14 NOTE — Telephone Encounter (Incomplete)
Pt called in clinic to report headaches that have not subsided since fall. She would like to have a CT scan done. Dr. Assunta Found recommended pt to report to nearest Emergency department. Mrs. Gassert verbally explained understanding of instructions.

## 2020-05-21 IMAGING — DX DG SACRUM/COCCYX 2+V
3 series · 3 of 3 positions shown · non-contrast
Comparison: None.

CLINICAL DATA: Sacrococcygeal pain following a fall this morning.

EXAM:
SACRUM AND COCCYX - 2+ VIEW

[coccyx ap]
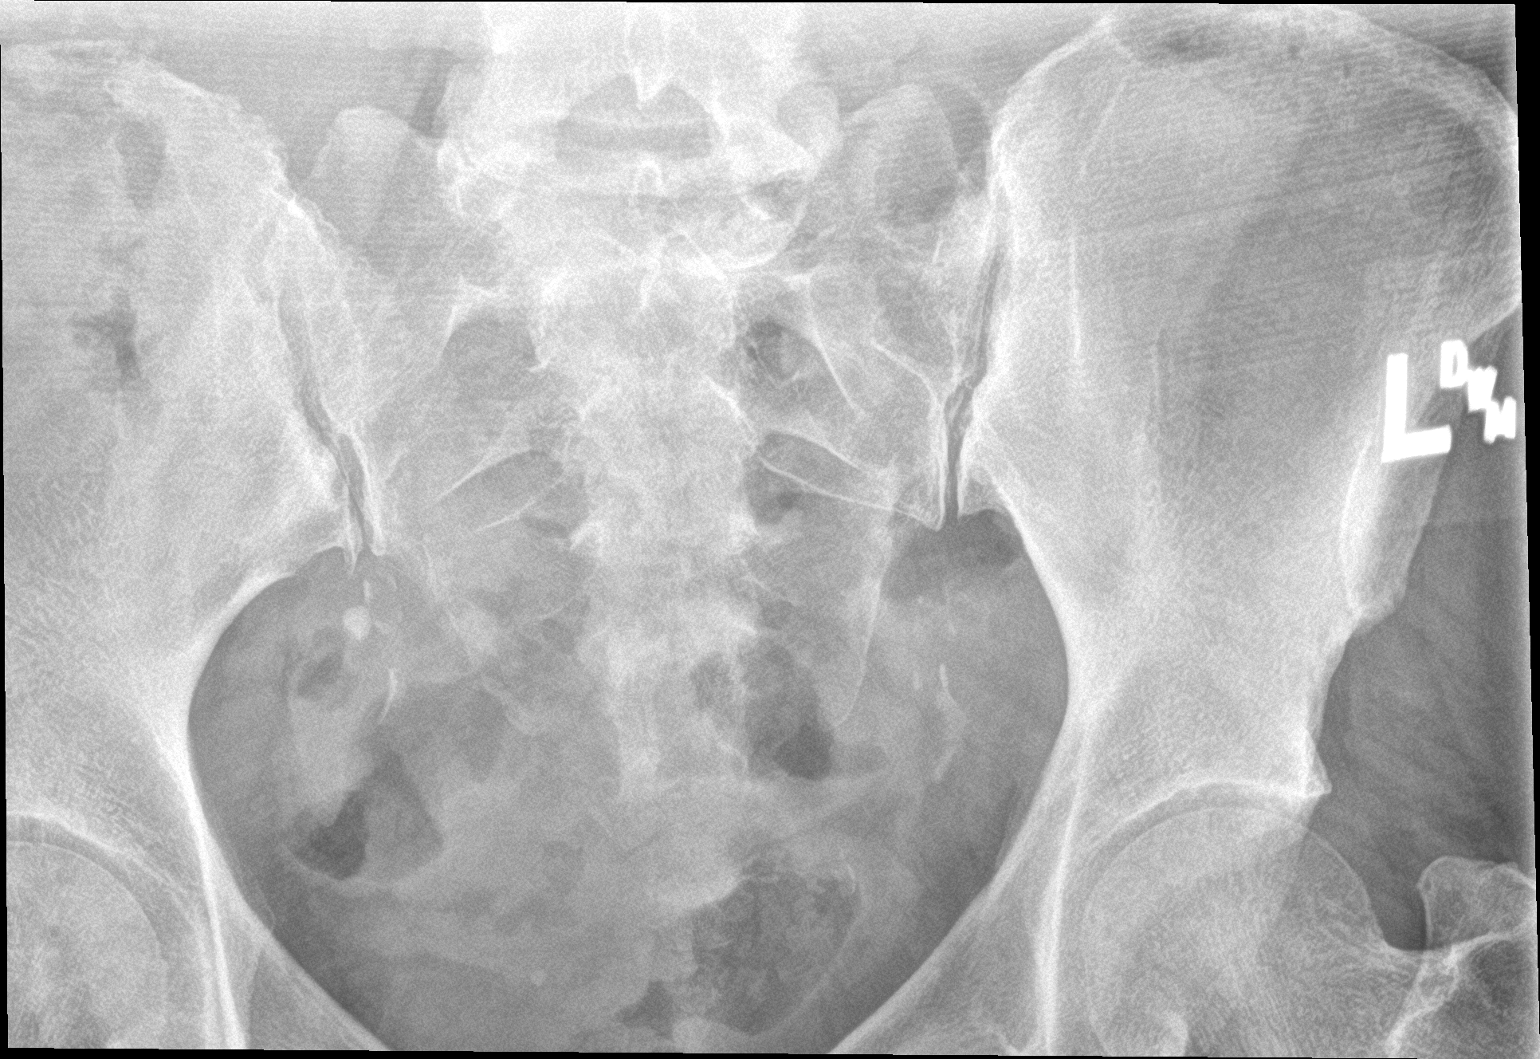

[sacrum ap]
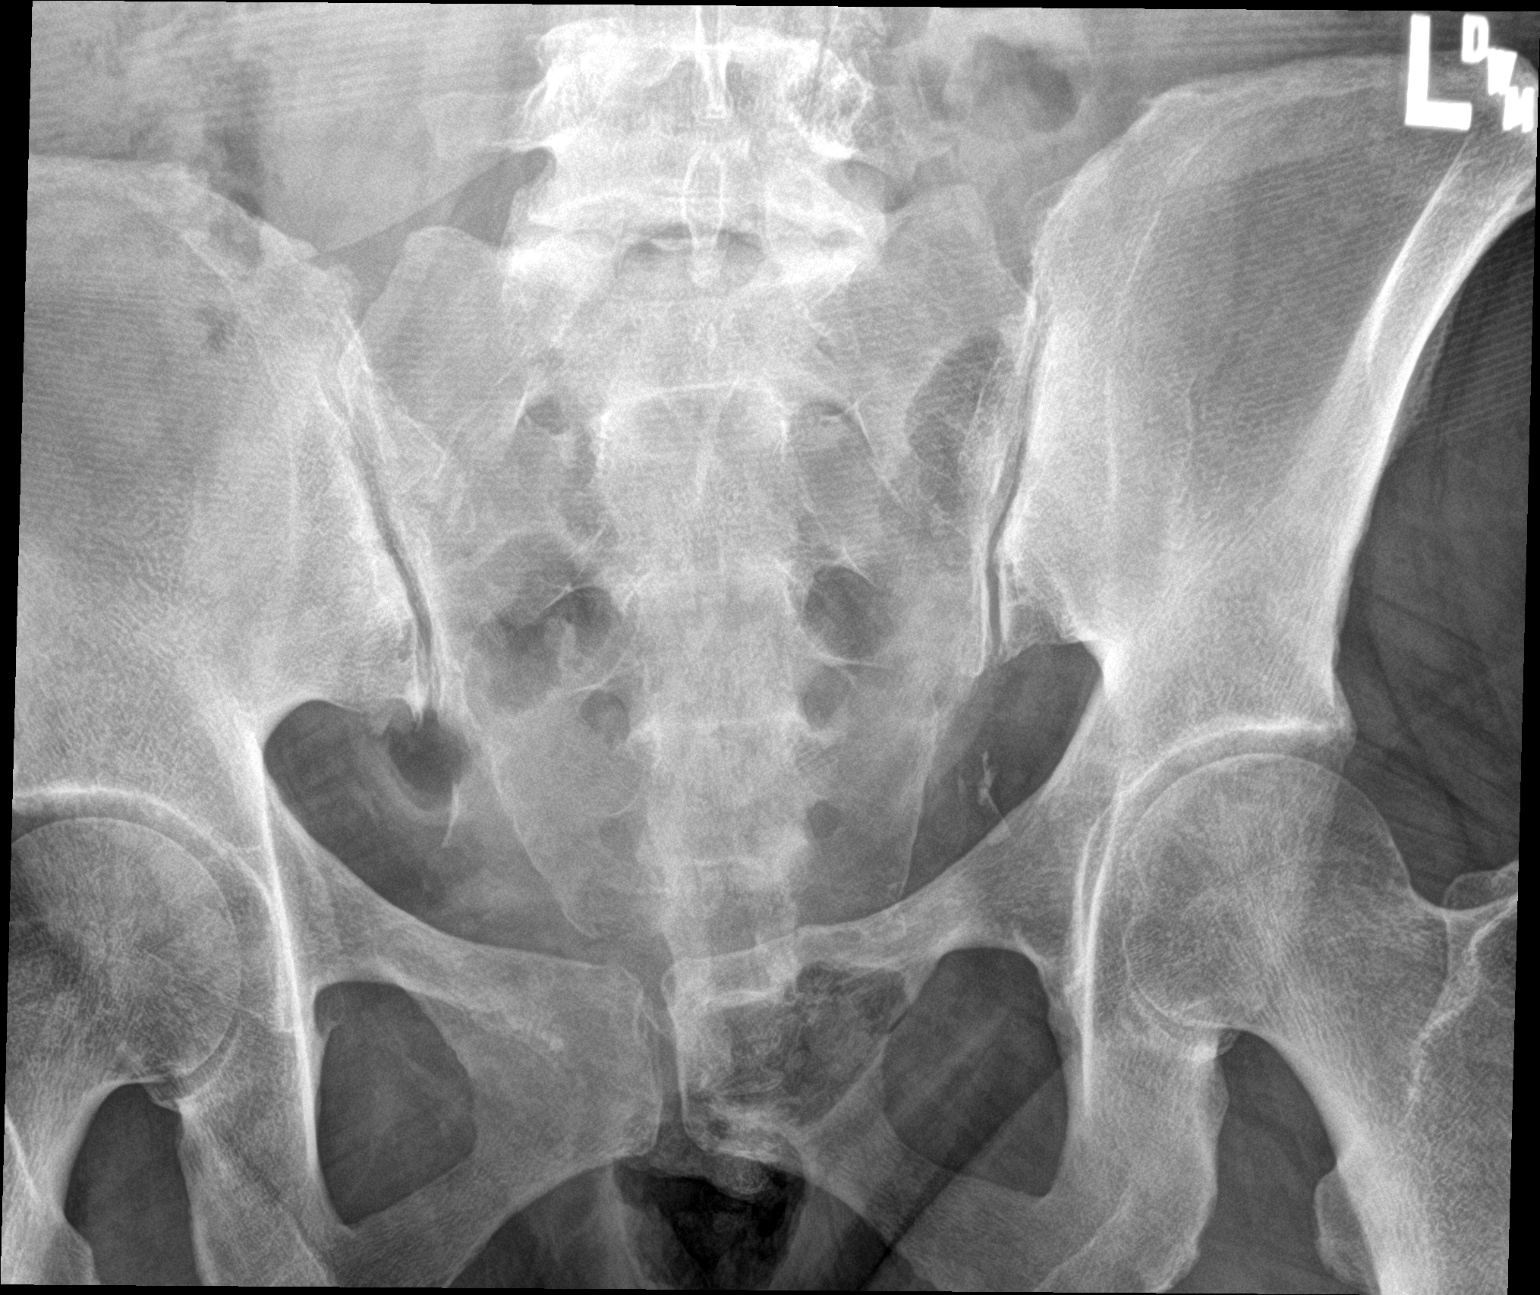

[sacrum lat]
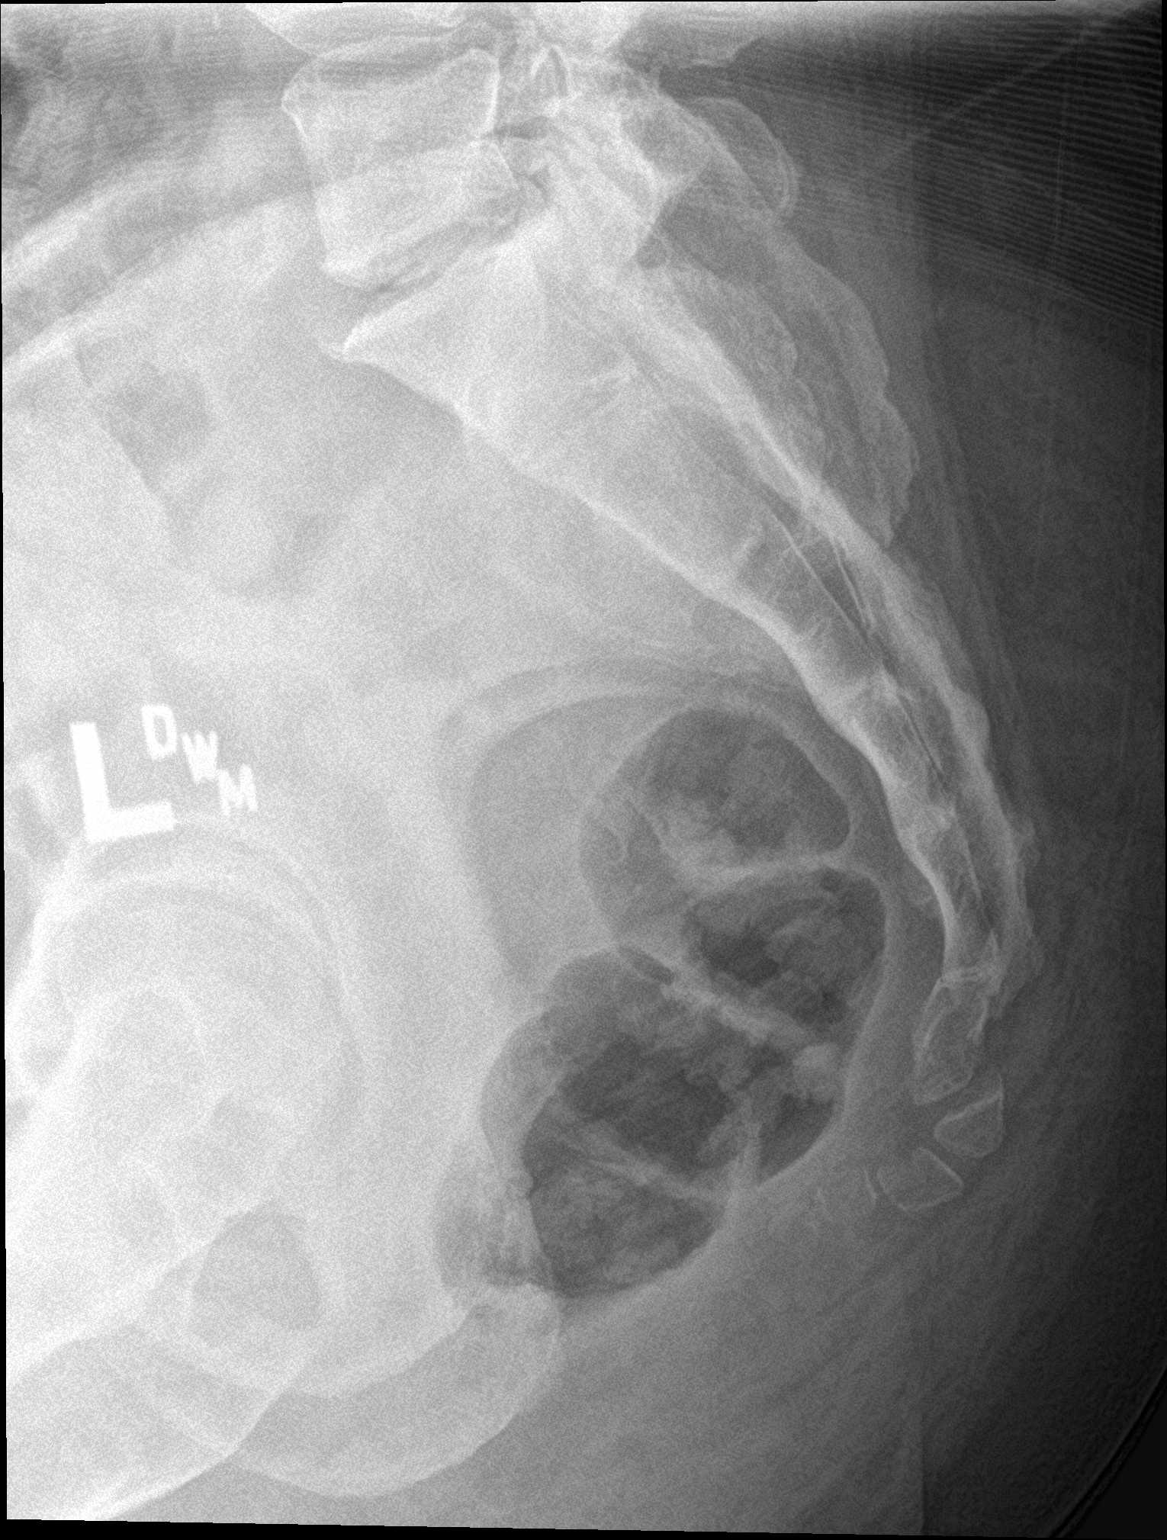

[3 of 3 positions shown; findings below may reference images not displayed]

FINDINGS: There is 5 mm of posterior subluxation of the coccyx beginning at
the 2nd coccygeal segment. No fractures are seen. L5-S1 degenerative
changes. Bilateral atheromatous arterial calcifications.
IMPRESSION: 1. Approximately 5 mm of posterior subluxation of the coccyx
beginning at the 2nd coccygeal segment.
2. No fracture.
3. L5-S1 degenerative changes.
